# Patient Record
Sex: Male | Born: 2000 | Race: Black or African American | Hispanic: No | Marital: Single | Smoking: Never smoker
Health system: Southern US, Community
[De-identification: ages and names within clinical notes are randomized; demographics above are authoritative.]

## PROBLEM LIST (undated history)

## (undated) DIAGNOSIS — J45909 Unspecified asthma, uncomplicated: Secondary | ICD-10-CM

---
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
## 2021-10-17 ENCOUNTER — Ambulatory Visit
Admission: EM | Admit: 2021-10-17 | Discharge: 2021-10-17 | Disposition: A | Discharge status: Home / Self Care | Payer: Medicaid Other | Attending: Family Medicine | Admitting: Family Medicine

## 2021-10-17 DIAGNOSIS — L309 Dermatitis, unspecified: Secondary | ICD-10-CM | POA: Diagnosis not present

## 2021-10-17 MED ORDER — TRIAMCINOLONE ACETONIDE 0.1 % EX CREA: 1.0000 "application " | TOPICAL_CREAM | Freq: Two times a day (BID) | CUTANEOUS | 0 refills | Status: AC | PRN

## 2021-10-17 MED ORDER — PREDNISONE 20 MG PO TABS: 40.0000 mg | ORAL_TABLET | Freq: Every day | ORAL | 0 refills | Status: DC

## 2021-10-17 NOTE — ED Triage Notes (Signed)
Patient presents to Urgent Care with complaints of rash on bilateral arms, legs, back and chest  since last week. Patient reports Eucerin cream benadryl with minimal help. Pt reports pmh excema

## 2021-10-17 NOTE — ED Provider Notes (Signed)
[facility]    CSN: [id number] Arrival date & time: 10/17/21  1634      History   Chief Complaint Chief Complaint  Patient presents with   Rash    HPI Carlos Morris is a 21 y.o. male.   Presenting today with itchy rash to bilateral arms, legs, back, chest.  States he has a history of eczema in these areas, has been trying Eucerin, Benadryl, hydrocortisone with no relief.  No new exposures, medications, foods recently.   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     Home Medications    Prior to Admission medications   Medication Sig Start Date End Date Taking? Authorizing Provider  predniSONE (DELTASONE) 20 MG tablet Take 2 tablets (40 mg total) by mouth daily with breakfast. 10/17/21  Yes Particia Nearing, PA-C  triamcinolone cream (KENALOG) 0.1 % Apply 1 application. topically 2 (two) times daily as needed. 10/17/21  Yes Particia Nearing, PA-C  albuterol (VENTOLIN HFA) 108 (90 BASE) MCG/ACT inhaler Inhale 2 puffs into the lungs as directed. 2-4 puffs every four hours as needed for wheezing     [provider]  APPLE CIDER VINEGAR PO Take 1 tablet by mouth daily.    [provider]  ASHWAGANDHA PO Take 1 tablet by mouth daily.    [provider]  diphenhydrAMINE (BENADRYL) 25 MG tablet Take 1 tablet (25 mg total) by mouth every 6 (six) hours for 2 days. 03/10/20 03/12/20  Gailen Shelter, PA  EPINEPHrine (EPIPEN 2-PAK) 0.3 mg/0.3 mL IJ SOAJ injection Inject 0.3 mg into the muscle as needed for up to 2 doses for anaphylaxis. 03/10/20   Gailen Shelter, PA  Green Tea, Camellia sinensis, (GREEN TEA EXTRACT PO) Take 1 tablet by mouth daily.    [provider]  Vitamin E 100  units TABS Take 1 tablet by mouth daily.    [provider]    Family History No family history on file.  Social History Social History   Tobacco Use   Smoking status: Never   Smokeless tobacco: Never  Substance Use Topics   Alcohol use: No   Drug use: No     Allergies   Other   Review of Systems Review of Systems Per HPI  Physical Exam Triage Vital Signs ED Triage Vitals  Enc Vitals Group     BP 10/17/21 1658 (!) 141/88     Pulse --      Resp 10/17/21 1658 17     Temp 10/17/21 1658 97.9 F (36.6 C)     Temp src --      SpO2 --      Weight --      Height --      Head Circumference --      Peak Flow --      Pain Score 10/17/21 1657 7     Pain Loc --      Pain Edu? --      Excl. in GC? --    No data found.  Updated Vital Signs BP (!) 141/88 (BP Location: Right Arm)   Temp 97.9 F (36.6 C)   Resp 17   Visual Acuity Right Eye Distance:  Left Eye Distance:   Bilateral Distance:    Right Eye Near:   Left Eye Near:    Bilateral Near:     Physical Exam Vitals and nursing note reviewed.  Constitutional:      Appearance: Normal appearance.  HENT:     Head: Atraumatic.  Eyes:     Extraocular Movements: Extraocular movements intact.     Conjunctiva/sclera: Conjunctivae normal.  Cardiovascular:     Rate and Rhythm: Normal rate and regular rhythm.  Pulmonary:     Effort: Pulmonary effort is normal.     Breath sounds: Normal breath sounds.  Musculoskeletal:        General: Normal range of motion.     Cervical back: Normal range of motion and neck supple.  Skin:    General: Skin is warm and dry.     Findings: Rash present.     Comments: Hyperpigmented maculopapular lesions across extremities  Neurological:     General: No focal deficit present.     Mental Status: He is oriented to person, place, and time.  Psychiatric:        Mood and Affect: Mood normal.        Thought Content: Thought content normal.        Judgment: Judgment  normal.     UC Treatments / Results  Labs (all labs ordered are listed, but only abnormal results are displayed) Labs Reviewed - No data to display  EKG   Radiology No results found.  Procedures Procedures (including critical care time)  Medications Ordered in UC Medications - No data to display  Initial Impression / Assessment and Plan / UC Course  I have reviewed the triage vital signs and the nursing notes.  Pertinent labs & imaging results that were available during my care of the patient were reviewed by me and considered in my medical decision making (see chart for details).     Suspect significant eczema flare.  Treat with course of prednisone, triamcinolone cream and good moisturizing regimen.  Return for worsening symptoms.  Final Clinical Impressions(s) / UC Diagnoses   Final diagnoses:  Eczema, unspecified type   Discharge Instructions   None    ED Prescriptions     Medication Sig Dispense Auth. Provider   predniSONE (DELTASONE) 20 MG tablet Take 2 tablets (40 mg total) by mouth daily with breakfast. 10 tablet Particia Nearing, PA-C   triamcinolone cream (KENALOG) 0.1 % Apply 1 application. topically 2 (two) times daily as needed. 80 g Particia Nearing, [place]      PDMP not reviewed this encounter.   Particia Nearing, [place] 10/17/21 1734

## 2021-10-18 ENCOUNTER — Encounter (HOSPITAL_COMMUNITY): Payer: Self-pay

## 2021-10-18 ENCOUNTER — Emergency Department (HOSPITAL_COMMUNITY)
Admission: EM | Admit: 2021-10-18 | Discharge: 2021-10-18 | Disposition: A | Discharge status: Home / Self Care | Payer: Medicaid Other | Attending: Emergency Medicine | Admitting: Emergency Medicine

## 2021-10-18 ENCOUNTER — Other Ambulatory Visit: Payer: Self-pay

## 2021-10-18 DIAGNOSIS — J45909 Unspecified asthma, uncomplicated: Secondary | ICD-10-CM | POA: Diagnosis not present

## 2021-10-18 DIAGNOSIS — Z79899 Other long term (current) drug therapy: Secondary | ICD-10-CM | POA: Diagnosis not present

## 2021-10-18 DIAGNOSIS — R21 Rash and other nonspecific skin eruption: Secondary | ICD-10-CM | POA: Diagnosis present

## 2021-10-18 DIAGNOSIS — T7840XA Allergy, unspecified, initial encounter: Secondary | ICD-10-CM

## 2021-10-18 DIAGNOSIS — L500 Allergic urticaria: Secondary | ICD-10-CM | POA: Diagnosis not present

## 2021-10-18 MED ORDER — FAMOTIDINE 20 MG PO TABS
20.0000 mg | ORAL_TABLET | Freq: Once | ORAL | Status: AC
  Administered 2021-10-18: 20 mg via ORAL
  Filled 2021-10-18: qty 1

## 2021-10-18 MED ORDER — FAMOTIDINE 20 MG PO TABS: 20.0000 mg | ORAL_TABLET | Freq: Every day | ORAL | 0 refills | Status: AC

## 2021-10-18 MED ORDER — PREDNISONE 10 MG PO TABS: 40.0000 mg | ORAL_TABLET | Freq: Every day | ORAL | 0 refills | Status: AC

## 2021-10-18 MED ORDER — EPINEPHRINE 0.3 MG/0.3ML IJ SOAJ: 0.3000 mg | INTRAMUSCULAR | 0 refills | Status: AC | PRN

## 2021-10-18 MED ORDER — DIPHENHYDRAMINE HCL 25 MG PO TABS: 25.0000 mg | ORAL_TABLET | Freq: Four times a day (QID) | ORAL | 0 refills | Status: AC

## 2021-10-18 MED ORDER — PREDNISONE 20 MG PO TABS
40.0000 mg | ORAL_TABLET | Freq: Once | ORAL | Status: AC
  Administered 2021-10-18: 40 mg via ORAL
  Filled 2021-10-18: qty 2

## 2021-10-18 NOTE — ED Provider Notes (Signed)
[facility]-EMERGENCY DEPT Provider Note  CSN: [id number] Arrival date & time: 10/18/21 0407  Chief Complaint(s) Allergic Reaction  HPI Carlos Morris is a 21 y.o. male    The history is provided by the patient.  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 Authorizing Provider  diphenhydrAMINE (BENADRYL) 25 MG tablet Take 1 tablet (25 mg total) by mouth every 6 (six) hours for 5 days. 10/18/21 10/23/21 Yes Arisha Gervais, Amadeo Garnet, MD  EPINEPHrine 0.3 mg/0.3 mL IJ SOAJ injection Inject 0.3 mg into the muscle as needed for anaphylaxis. 10/18/21  Yes Zaylin Pistilli, Amadeo Garnet, MD  famotidine (PEPCID) 20 MG tablet Take 1 tablet (20 mg total) by mouth daily for 5 days. 10/18/21 10/23/21 Yes Amelio Brosky, Amadeo Garnet, MD  predniSONE (DELTASONE) 10 MG tablet Take 4 tablets (40 mg total) by mouth daily for 4 days. 10/18/21 10/22/21 Yes Thaila Bottoms, Amadeo Garnet, MD  albuterol (VENTOLIN HFA) 108 (90 BASE) MCG/ACT inhaler Inhale 2 puffs into the lungs as directed. 2-4 puffs every four hours as needed for wheezing     [provider]  APPLE CIDER VINEGAR PO Take 1 tablet by mouth daily.    [provider]  ASHWAGANDHA PO Take 1 tablet by mouth daily.    [provider]  Chilton Si Tea, Camellia sinensis, (GREEN TEA EXTRACT PO) Take 1 tablet by mouth daily.    [provider]  triamcinolone cream (KENALOG) 0.1 % Apply 1 application. topically 2 (two) times daily as needed. 10/17/21   Particia Nearing, PA-C  Vitamin E 100 units TABS Take 1 tablet by mouth daily.    [provider]                                                                                                                                    Allergies Other  Review of Systems Review of Systems  Skin:  Positive for itching and rash.  As noted in HPI  Physical Exam Vital Signs  I have reviewed the triage vital signs BP (!) 148/80   Pulse 71   Temp 98 F (36.7 C)   Resp 18   SpO2 99%   Physical Exam Vitals reviewed.  Constitutional:      General:  He is not in acute distress.    Appearance: He is well-developed. He is not diaphoretic.  HENT:     Head: Normocephalic and atraumatic.     Nose: Nose normal.     Mouth/Throat:     Mouth: No angioedema.     Pharynx: No uvula swelling.  Eyes:     General: No scleral icterus.       Right eye: No discharge.        Left eye: No discharge.     Conjunctiva/sclera: Conjunctivae normal.     Pupils: Pupils are equal, round, and reactive to light.  Cardiovascular:     Rate and Rhythm: Normal rate and regular rhythm.     Heart sounds: No murmur heard.   No friction rub. No gallop.  Pulmonary:     Effort: Pulmonary effort is normal. No respiratory distress.     Breath sounds: Normal breath sounds. No stridor. No wheezing or rales.  Abdominal:     General: There is no distension.     Palpations: Abdomen is soft.     Tenderness: There is no abdominal tenderness.  Musculoskeletal:        General: No tenderness.     Cervical back: Normal range of motion and neck supple.  Skin:    General: Skin is warm and dry.     Findings: Rash present. No erythema. Rash is urticarial.  Neurological:     Mental  Status: He is alert and oriented to person, place, and time.    ED Results and Treatments Labs (all labs ordered are listed, but only abnormal results are displayed) Labs Reviewed - No data to display                                                                                                                       EKG  EKG Interpretation  Date/Time:    Ventricular Rate:    PR Interval:    QRS Duration:   QT Interval:    QTC Calculation:   R Axis:     Text Interpretation:         Radiology No results found.  Pertinent labs & imaging results that were available during my care of the patient were reviewed by me and considered in my medical decision making (see MDM for details).  Medications Ordered in ED Medications  predniSONE (DELTASONE) tablet 40 mg (40 mg Oral Given 10/18/21 0514)  famotidine (PEPCID) tablet 20 mg (20 mg Oral Given 10/18/21 0514)  Procedures Procedures  (including critical care time)  Medical Decision Making / ED Course    Complexity of Problem:  Co-morbidities/SDOH that complicate the patient evaluation/care: Eczema recently prescribed triamcinolone cream  Additional history obtained: Urgent care note from earlier today, where he was prescribed triamcinolone cream and prednisone.  Patient reports prednisone was not at the pharmacy  Patient's presenting problem/concern, DDX, and MDM listed below: Urticaria, throat swelling and difficulty breathing Improved with 50 mg of Benadryl prior to arrival No evidence of angioedema on exam, no wheezing. No evidence of anaphylaxis requiring epinephrine at this time. We will provide patient with oral steroids and Pepcid.  He will be monitored  Hospitalization Considered:  Yes, if edema recurs and patient requires multiple doses of epinephrine  Initial Intervention:  Noted  above    Complexity of Data:      ED Course:    Assessment, Add'l Intervention, and Reassessment: Allergic reaction Patient remained hemodynamic medically stable without recurrence of edema or shortness of breath Monitored for an additional 3 hours without recurrence    Final Clinical Impression(s) / ED Diagnoses Final diagnoses:  Allergic reaction, initial encounter   The patient appears reasonably screened and/or stabilized for discharge and I doubt any other medical condition or other [facility] requiring further screening, evaluation, or treatment in the ED at this time prior to discharge. Safe for discharge with strict return precautions.  Disposition: Discharge  Condition: Good  I have discussed the results, Dx and Tx plan with the patient/family who expressed understanding and agree(s) with the plan. Discharge instructions discussed at length. The patient/family was given strict return precautions who verbalized understanding of the instructions. No further questions at time of discharge.    ED Discharge Orders          Ordered    EPINEPHrine 0.3 mg/0.3 mL IJ SOAJ injection  As needed        10/18/21 0631    famotidine (PEPCID) 20 MG tablet  Daily        10/18/21 0631    diphenhydrAMINE (BENADRYL) 25 MG tablet  Every 6 hours        10/18/21 0631    predniSONE (DELTASONE) 10 MG tablet  Daily        10/18/21 0631            Follow Up: Primary care provider  Call  to schedule an appointment for close follow up           This chart was dictated using voice recognition software.  Despite best efforts to proofread,  errors can occur which can change the documentation meaning.    Nira Connardama, Laquonda Welby Eduardo, MD 10/18/21 [phone]
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
## 2023-04-11 ENCOUNTER — Other Ambulatory Visit: Payer: Self-pay

## 2023-04-11 ENCOUNTER — Encounter (HOSPITAL_BASED_OUTPATIENT_CLINIC_OR_DEPARTMENT_OTHER): Payer: Self-pay | Admitting: Emergency Medicine

## 2023-04-11 ENCOUNTER — Emergency Department (HOSPITAL_BASED_OUTPATIENT_CLINIC_OR_DEPARTMENT_OTHER)
Admission: EM | Admit: 2023-04-11 | Discharge: 2023-04-11 | Disposition: A | Discharge status: Home / Self Care | Payer: Medicaid Other | Attending: Emergency Medicine | Admitting: Emergency Medicine

## 2023-04-11 DIAGNOSIS — H539 Unspecified visual disturbance: Secondary | ICD-10-CM | POA: Insufficient documentation

## 2023-04-11 NOTE — Discharge Instructions (Signed)
Call Dr. Laruth Bouchard office to schedule a time to be seen for further evaluation of left eye visual disturbance.

## 2023-04-11 NOTE — ED Notes (Signed)
 RN reviewed discharge instructions with pt. Pt verbalized understanding and had no further questions. VSS upon discharge.  
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
## 2024-06-02 ENCOUNTER — Ambulatory Visit: Admitting: Emergency Medicine
